# Patient Record
Sex: Female | Born: 1970 | Race: White | Hispanic: No | Marital: Married | Smoking: Never smoker
Health system: Southern US, Community
[De-identification: ages and names within clinical notes are randomized; demographics above are authoritative.]

## PROBLEM LIST (undated history)

## (undated) DIAGNOSIS — F1010 Alcohol abuse, uncomplicated: Secondary | ICD-10-CM

## (undated) DIAGNOSIS — R748 Abnormal levels of other serum enzymes: Secondary | ICD-10-CM

---

## 2005-08-12 ENCOUNTER — Other Ambulatory Visit: Admission: RE | Admit: 2005-08-12 | Discharge: 2005-08-12 | Payer: Self-pay | Admitting: Obstetrics and Gynecology

## 2009-12-17 ENCOUNTER — Emergency Department (HOSPITAL_COMMUNITY): Admission: EM | Admit: 2009-12-17 | Discharge: 2009-12-17 | Payer: Self-pay | Admitting: Emergency Medicine

## 2010-01-07 ENCOUNTER — Emergency Department (HOSPITAL_COMMUNITY): Admission: EM | Admit: 2010-01-07 | Discharge: 2010-01-07 | Payer: Self-pay | Admitting: Emergency Medicine

## 2010-07-31 LAB — GLUCOSE, CAPILLARY: Glucose-Capillary: 140 mg/dL — ABNORMAL HIGH (ref 70–99)

## 2010-07-31 LAB — POCT I-STAT, CHEM 8
BUN: 6 mg/dL (ref 6–23)
Creatinine, Ser: 0.6 mg/dL (ref 0.4–1.2)
Creatinine, Ser: 0.6 mg/dL (ref 0.4–1.2)
Glucose, Bld: 116 mg/dL — ABNORMAL HIGH (ref 70–99)
HCT: 40 % (ref 36.0–46.0)
Hemoglobin: 13.9 g/dL (ref 12.0–15.0)
Hemoglobin: 13.6 g/dL (ref 12.0–15.0)
Potassium: 3.9 mEq/L (ref 3.5–5.1)
Sodium: 134 mEq/L — ABNORMAL LOW (ref 135–145)
Sodium: 134 mEq/L — ABNORMAL LOW (ref 135–145)
TCO2: 21 mmol/L (ref 0–100)

## 2010-07-31 LAB — DIFFERENTIAL
Basophils Absolute: 0 10*3/uL (ref 0.0–0.1)
Eosinophils Relative: 0 % (ref 0–5)
Lymphocytes Relative: 13 % (ref 12–46)
Lymphs Abs: 0.7 10*3/uL (ref 0.7–4.0)
Neutro Abs: 4.2 10*3/uL (ref 1.7–7.7)
Neutrophils Relative %: 77 % (ref 43–77)

## 2010-07-31 LAB — CBC
Platelets: 261 10*3/uL (ref 150–400)
RBC: 3.86 MIL/uL — ABNORMAL LOW (ref 3.87–5.11)
RDW: 12.9 % (ref 11.5–15.5)
WBC: 5.5 10*3/uL (ref 4.0–10.5)

## 2010-07-31 LAB — URINE MICROSCOPIC-ADD ON

## 2010-07-31 LAB — URINALYSIS, ROUTINE W REFLEX MICROSCOPIC
Bilirubin Urine: NEGATIVE
Glucose, UA: NEGATIVE mg/dL
Specific Gravity, Urine: 1.005 (ref 1.005–1.030)

## 2011-06-23 ENCOUNTER — Other Ambulatory Visit: Payer: Self-pay | Admitting: Obstetrics and Gynecology

## 2011-06-23 DIAGNOSIS — N644 Mastodynia: Secondary | ICD-10-CM

## 2011-07-01 ENCOUNTER — Ambulatory Visit
Admission: RE | Admit: 2011-07-01 | Discharge: 2011-07-01 | Disposition: A | Discharge status: Home / Self Care | Payer: Self-pay | Source: Ambulatory Visit | Attending: Obstetrics and Gynecology | Admitting: Obstetrics and Gynecology

## 2011-07-01 DIAGNOSIS — N644 Mastodynia: Secondary | ICD-10-CM

## 2012-07-06 ENCOUNTER — Other Ambulatory Visit: Payer: Self-pay | Admitting: Obstetrics and Gynecology

## 2012-07-06 DIAGNOSIS — Z1231 Encounter for screening mammogram for malignant neoplasm of breast: Secondary | ICD-10-CM

## 2012-08-08 ENCOUNTER — Ambulatory Visit
Admission: RE | Admit: 2012-08-08 | Discharge: 2012-08-08 | Disposition: A | Discharge status: Home / Self Care | Payer: 59 | Source: Ambulatory Visit | Attending: Obstetrics and Gynecology | Admitting: Obstetrics and Gynecology

## 2012-08-08 DIAGNOSIS — Z1231 Encounter for screening mammogram for malignant neoplasm of breast: Secondary | ICD-10-CM

## 2012-09-11 ENCOUNTER — Other Ambulatory Visit: Payer: Self-pay | Admitting: Neurosurgery

## 2012-09-11 DIAGNOSIS — S0990XA Unspecified injury of head, initial encounter: Secondary | ICD-10-CM

## 2012-09-12 ENCOUNTER — Inpatient Hospital Stay: Admission: RE | Admit: 2012-09-12 | Payer: 59 | Source: Ambulatory Visit

## 2012-09-12 ENCOUNTER — Ambulatory Visit
Admission: RE | Admit: 2012-09-12 | Discharge: 2012-09-12 | Disposition: A | Discharge status: Home / Self Care | Payer: 59 | Source: Ambulatory Visit | Attending: Neurosurgery | Admitting: Neurosurgery

## 2012-09-12 DIAGNOSIS — S0990XA Unspecified injury of head, initial encounter: Secondary | ICD-10-CM

## 2013-08-10 ENCOUNTER — Other Ambulatory Visit: Payer: Self-pay | Admitting: Pediatrics

## 2013-08-10 LAB — CBC WITH DIFFERENTIAL/PLATELET
BASOS PCT: 2 % — AB (ref 0–1)
Basophils Absolute: 0.1 10*3/uL (ref 0.0–0.1)
Eosinophils Absolute: 0 10*3/uL (ref 0.0–0.7)
Eosinophils Relative: 0 % (ref 0–5)
HEMATOCRIT: 42.1 % (ref 36.0–46.0)
HEMOGLOBIN: 14.6 g/dL (ref 12.0–15.0)
LYMPHS ABS: 0.7 10*3/uL (ref 0.7–4.0)
Lymphocytes Relative: 18 % (ref 12–46)
MCH: 32.1 pg (ref 26.0–34.0)
MCHC: 34.7 g/dL (ref 30.0–36.0)
MCV: 92.5 fL (ref 78.0–100.0)
MONO ABS: 0.2 10*3/uL (ref 0.1–1.0)
MONOS PCT: 6 % (ref 3–12)
NEUTROS ABS: 3 10*3/uL (ref 1.7–7.7)
Neutrophils Relative %: 74 % (ref 43–77)
Platelets: 292 10*3/uL (ref 150–400)
RBC: 4.55 MIL/uL (ref 3.87–5.11)
RDW: 13.2 % (ref 11.5–15.5)
WBC: 4 10*3/uL (ref 4.0–10.5)

## 2013-08-10 LAB — BASIC METABOLIC PANEL
BUN: 12 mg/dL (ref 6–23)
CHLORIDE: 95 meq/L — AB (ref 96–112)
CO2: 25 mEq/L (ref 19–32)
Calcium: 9.9 mg/dL (ref 8.4–10.5)
Creat: 0.63 mg/dL (ref 0.50–1.10)
Glucose, Bld: 73 mg/dL (ref 70–99)
POTASSIUM: 4.4 meq/L (ref 3.5–5.3)
SODIUM: 143 meq/L (ref 135–145)

## 2013-08-10 LAB — HEPATIC FUNCTION PANEL
ALK PHOS: 47 U/L (ref 39–117)
ALT: 104 U/L — AB (ref 0–35)
AST: 182 U/L — AB (ref 0–37)
Albumin: 5.4 g/dL — ABNORMAL HIGH (ref 3.5–5.2)
BILIRUBIN DIRECT: 0.2 mg/dL (ref 0.0–0.3)
BILIRUBIN INDIRECT: 0.6 mg/dL (ref 0.2–1.2)
BILIRUBIN TOTAL: 0.8 mg/dL (ref 0.2–1.2)
Total Protein: 8.4 g/dL — ABNORMAL HIGH (ref 6.0–8.3)

## 2013-08-10 LAB — ALBUMIN: Albumin: 5.4 g/dL — ABNORMAL HIGH (ref 3.5–5.2)

## 2013-08-11 LAB — GAMMA GT: GGT: 121 U/L — ABNORMAL HIGH (ref 7–51)

## 2013-08-11 LAB — PROTIME-INR
INR: 1.01 (ref <1.50)
PROTHROMBIN TIME: 13.2 s (ref 11.6–15.2)

## 2013-08-11 LAB — APTT: APTT: 26 s (ref 24–37)

## 2013-11-14 ENCOUNTER — Encounter (HOSPITAL_COMMUNITY): Payer: Self-pay | Admitting: Emergency Medicine

## 2013-11-14 ENCOUNTER — Emergency Department (HOSPITAL_COMMUNITY)
Admission: EM | Admit: 2013-11-14 | Discharge: 2013-11-14 | Discharge status: Against Medical Advice | Payer: 59 | Attending: Emergency Medicine | Admitting: Emergency Medicine

## 2013-11-14 DIAGNOSIS — F1010 Alcohol abuse, uncomplicated: Secondary | ICD-10-CM | POA: Insufficient documentation

## 2013-11-14 HISTORY — DX: Abnormal levels of other serum enzymes: R74.8

## 2013-11-14 HISTORY — DX: Alcohol abuse, uncomplicated: F10.10

## 2013-11-14 LAB — COMPREHENSIVE METABOLIC PANEL
ALT: 20 U/L (ref 0–35)
AST: 31 U/L (ref 0–37)
Albumin: 4.6 g/dL (ref 3.5–5.2)
Alkaline Phosphatase: 65 U/L (ref 39–117)
Anion gap: 18 — ABNORMAL HIGH (ref 5–15)
BILIRUBIN TOTAL: 0.6 mg/dL (ref 0.3–1.2)
BUN: 13 mg/dL (ref 6–23)
CALCIUM: 9.5 mg/dL (ref 8.4–10.5)
CHLORIDE: 100 meq/L (ref 96–112)
CO2: 27 meq/L (ref 19–32)
CREATININE: 0.53 mg/dL (ref 0.50–1.10)
GLUCOSE: 96 mg/dL (ref 70–99)
Potassium: 4.1 mEq/L (ref 3.7–5.3)
Sodium: 145 mEq/L (ref 137–147)
Total Protein: 8 g/dL (ref 6.0–8.3)

## 2013-11-14 LAB — ETHANOL: ALCOHOL ETHYL (B): 299 mg/dL — AB (ref 0–11)

## 2013-11-14 LAB — CBC
HEMATOCRIT: 37.4 % (ref 36.0–46.0)
HEMOGLOBIN: 12.5 g/dL (ref 12.0–15.0)
MCH: 30.3 pg (ref 26.0–34.0)
MCHC: 33.4 g/dL (ref 30.0–36.0)
MCV: 90.6 fL (ref 78.0–100.0)
PLATELETS: 345 10*3/uL (ref 150–400)
RBC: 4.13 MIL/uL (ref 3.87–5.11)
RDW: 12.6 % (ref 11.5–15.5)
WBC: 4.1 10*3/uL (ref 4.0–10.5)

## 2013-11-14 LAB — SALICYLATE LEVEL

## 2013-11-14 LAB — ACETAMINOPHEN LEVEL: Acetaminophen (Tylenol), Serum: <15 ug/mL (ref 10–30)

## 2013-11-14 NOTE — ED Notes (Signed)
PT went to [facility] for etoh and narcotic.  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  Last drink was this am around 6am.  No seizures with withdrawal.

## 2013-11-14 NOTE — ED Notes (Signed)
Pt refusing to wait any longer; encouraged pt to stay pt sts to follow up another time

## 2014-11-13 IMAGING — CT CT HEAD W/O CM
2 series · 16 of 30 positions shown, 18 images · non-contrast
Comparison: None.

CLINICAL DATA: Injury 9 days ago.  Vertigo.

CT HEAD WITHOUT CONTRAST
TECHNIQUE: Contiguous axial images were obtained from the base of
the skull through the vertex without contrast.

[Series 3: head bone · axial · 0.49mm/px · z∈[+1,+111]mm · 8 of 56 slices shown]
[im 6/56  bone]
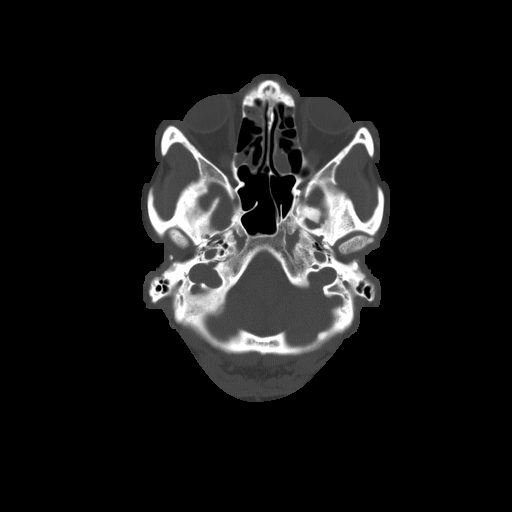
[im 12/56  bone]
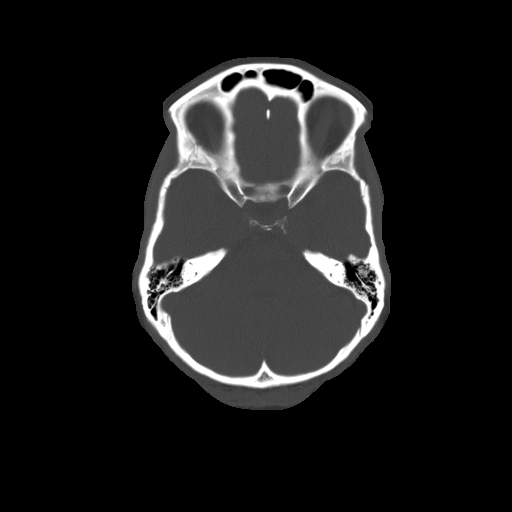
[im 18/56  bone]
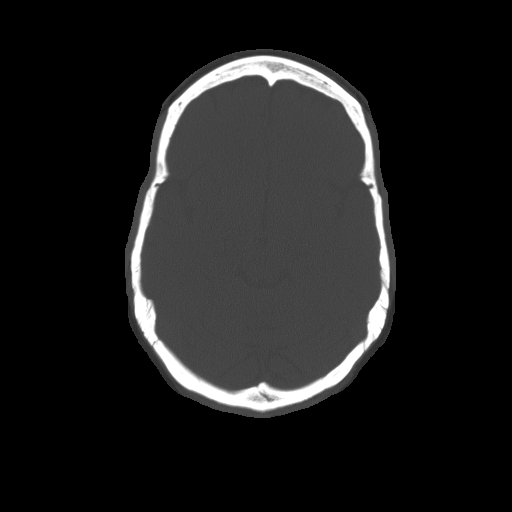
[im 24/56  bone]
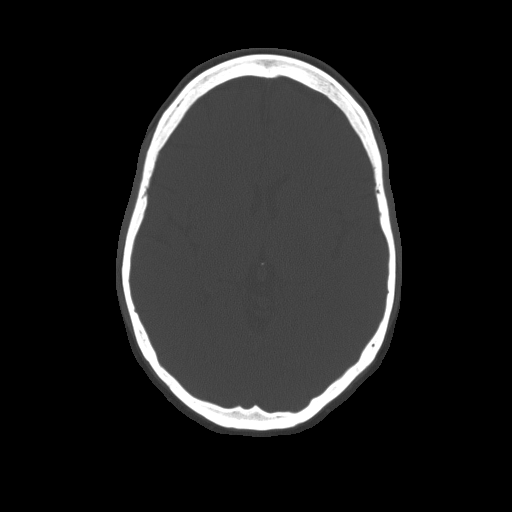
[im 32/56  bone]
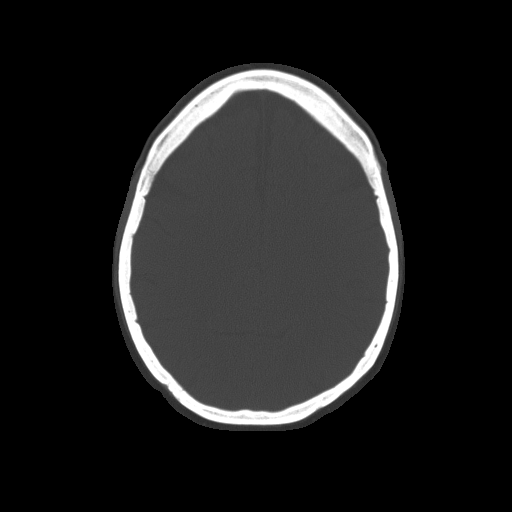
[im 38/56  bone]
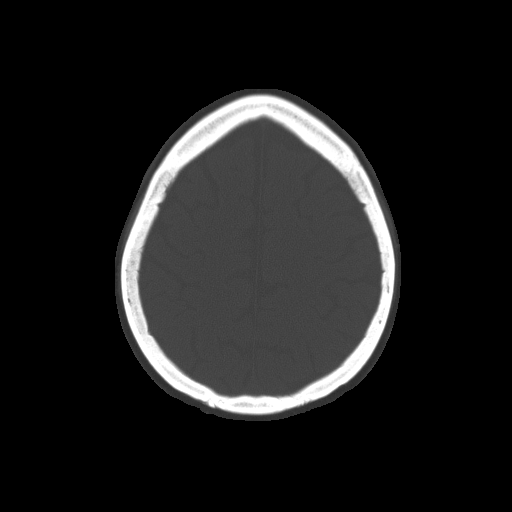
[im 44/56  bone]
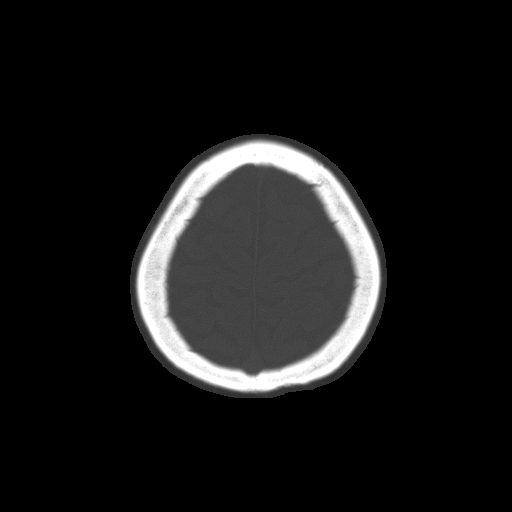
[im 50/56  bone]
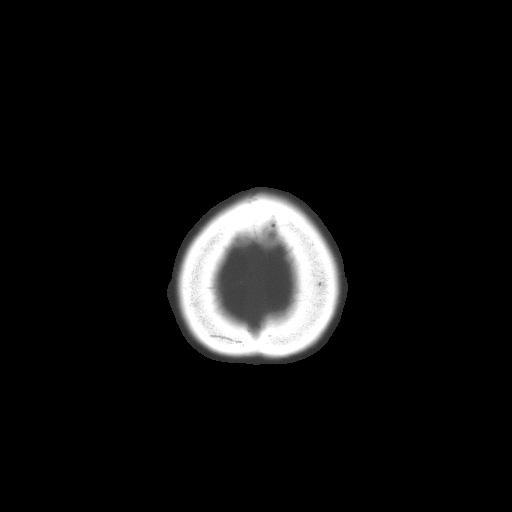

[Series 32: 3d filtered head w/o · axial · non-contrast · 0.49mm/px · z∈[+5,+110]mm · 8 of 28 slices shown, 10 images]
[im 4/28  brain]
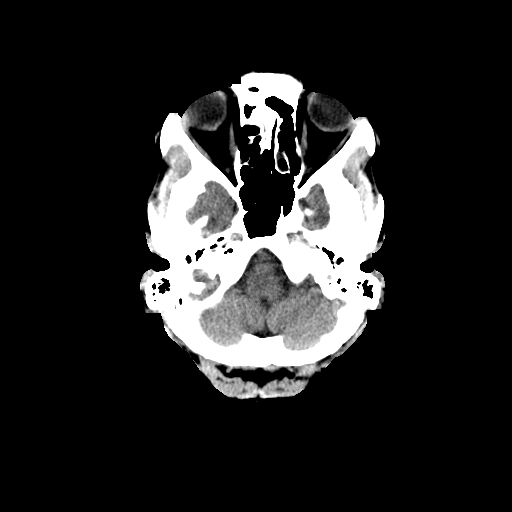
[im 4/28  bone]
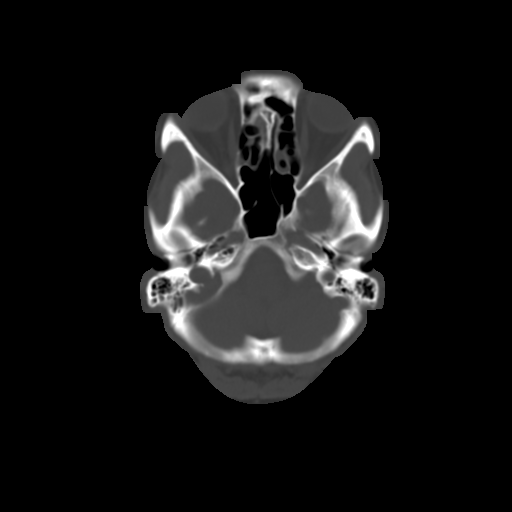
[im 7/28  brain]
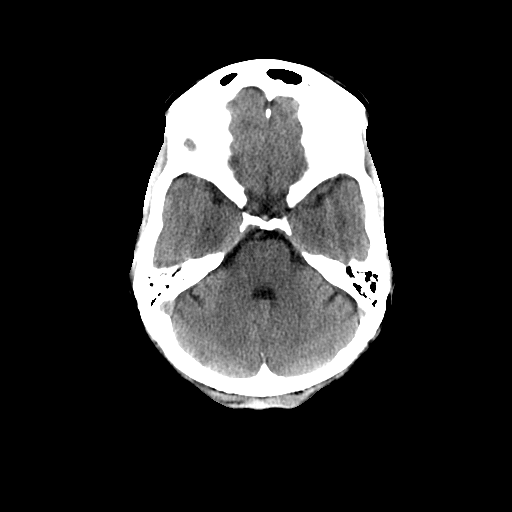
[im 10/28  brain]
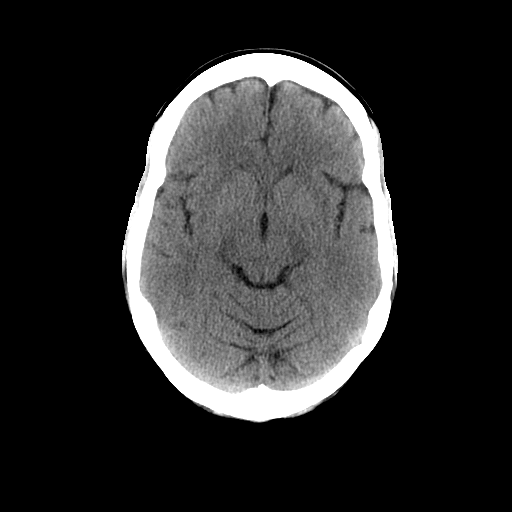
[im 13/28  brain]
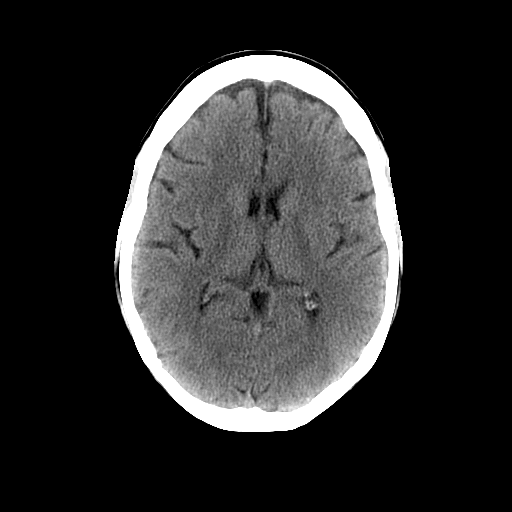
[im 16/28  brain]
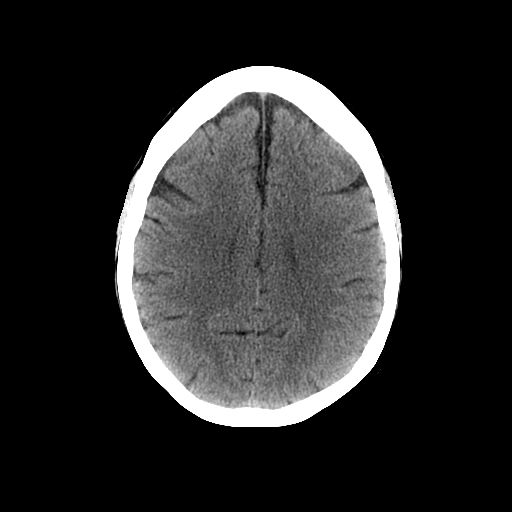
[im 16/28  bone]
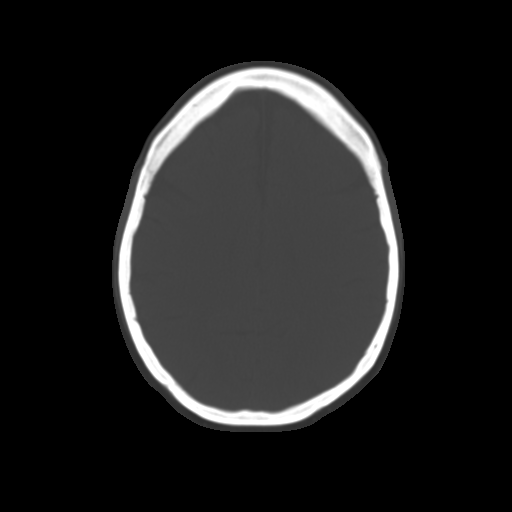
[im 19/28  brain]
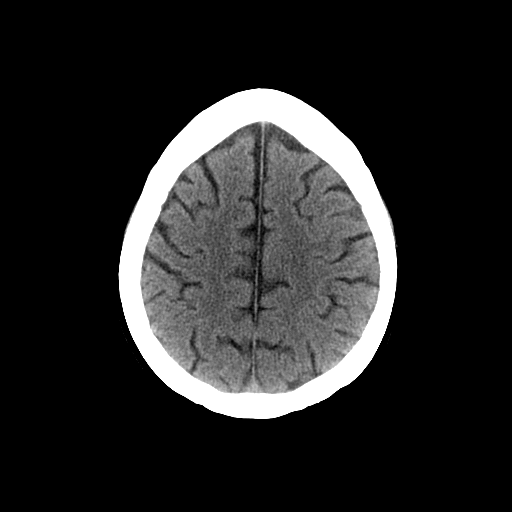
[im 22/28  brain]
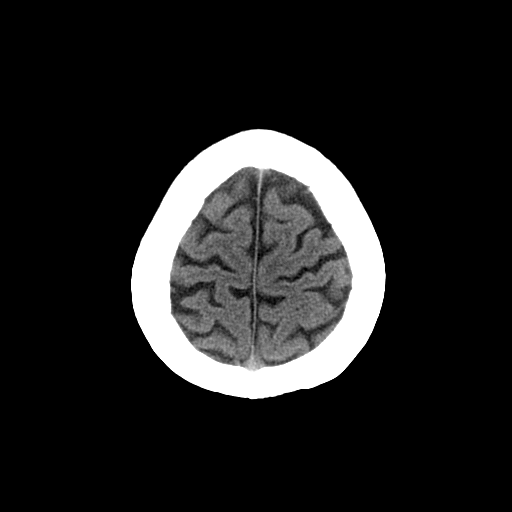
[im 25/28  brain]
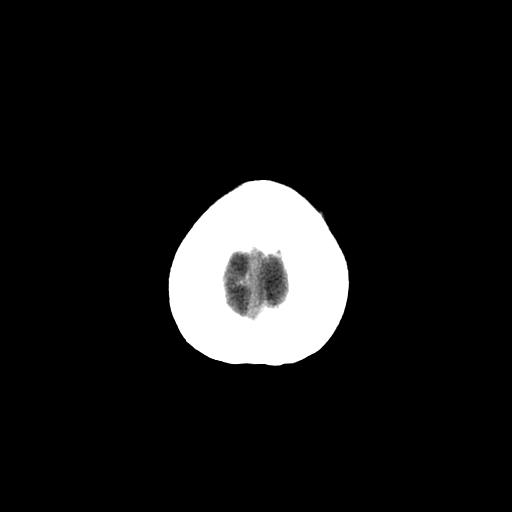

[16 of 30 positions shown; findings below may reference images not displayed]

FINDINGS: No skull fracture or intracranial hemorrhage.

No hydrocephalus.

No intracranial mass lesion detected on this unenhanced exam..

No CT evidence of large acute infarct.

Mucosal thickening left maxillary sinus without air fluid level to
suggest orbital floor injury.  Mucosal thickening/partial
opacification ethmoid sinus air cells bilaterally.

Visualized orbital structures unremarkable.

Mastoid air cells and middle ear cavities are clear.
IMPRESSION: No acute abnormality with the exception of paranasal sinus mucosal
thickening/partial opacification as noted above.

This has been [facility] report utilizing dashboard call
feature.
